# Patient Record
Sex: Female | Born: 1995 | Race: Black or African American | Hispanic: No | Marital: Single | Smoking: Never smoker
Health system: Southern US, Community
[De-identification: ages and names within clinical notes are randomized; demographics above are authoritative.]

## PROBLEM LIST (undated history)

## (undated) DIAGNOSIS — J45909 Unspecified asthma, uncomplicated: Secondary | ICD-10-CM

## (undated) DIAGNOSIS — D649 Anemia, unspecified: Secondary | ICD-10-CM

---
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
## 2016-01-01 ENCOUNTER — Emergency Department (HOSPITAL_COMMUNITY)
Admission: EM | Admit: 2016-01-01 | Discharge: 2016-01-01 | Disposition: A | Discharge status: Home / Self Care | Payer: Managed Care, Other (non HMO) | Attending: Emergency Medicine | Admitting: Emergency Medicine

## 2016-01-01 ENCOUNTER — Emergency Department (HOSPITAL_COMMUNITY): Payer: Managed Care, Other (non HMO)

## 2016-01-01 DIAGNOSIS — J029 Acute pharyngitis, unspecified: Secondary | ICD-10-CM

## 2016-01-01 HISTORY — DX: Unspecified asthma, uncomplicated: J45.909

## 2016-01-01 LAB — URINALYSIS, ROUTINE W REFLEX MICROSCOPIC
Glucose, UA: NEGATIVE mg/dL
Hgb urine dipstick: NEGATIVE
Ketones, ur: 40 mg/dL — AB
Leukocytes, UA: NEGATIVE
Nitrite: NEGATIVE
Protein, ur: 30 mg/dL — AB
SPECIFIC GRAVITY, URINE: 1.026 (ref 1.005–1.030)
pH: 6 (ref 5.0–8.0)

## 2016-01-01 LAB — POC URINE PREG, ED: PREG TEST UR: NEGATIVE

## 2016-01-01 LAB — URINE MICROSCOPIC-ADD ON

## 2016-01-01 LAB — RAPID STREP SCREEN (MED CTR MEBANE ONLY): Streptococcus, Group A Screen (Direct): NEGATIVE

## 2016-01-01 MED ORDER — PENICILLIN V POTASSIUM 250 MG PO TABS
500.0000 mg | ORAL_TABLET | Freq: Once | ORAL | Status: AC
  Administered 2016-01-01: 500 mg via ORAL
  Filled 2016-01-01: qty 2

## 2016-01-01 MED ORDER — ALBUTEROL SULFATE (2.5 MG/3ML) 0.083% IN NEBU
5.0000 mg | INHALATION_SOLUTION | Freq: Once | RESPIRATORY_TRACT | Status: AC
  Administered 2016-01-01: 5 mg via RESPIRATORY_TRACT
  Filled 2016-01-01: qty 6

## 2016-01-01 MED ORDER — PENICILLIN V POTASSIUM 500 MG PO TABS: 500.0000 mg | ORAL_TABLET | Freq: Three times a day (TID) | ORAL | 0 refills | Status: DC

## 2016-01-01 MED ORDER — IPRATROPIUM BROMIDE 0.02 % IN SOLN
0.5000 mg | Freq: Once | RESPIRATORY_TRACT | Status: AC
  Administered 2016-01-01: 0.5 mg via RESPIRATORY_TRACT
  Filled 2016-01-01: qty 2.5

## 2016-01-01 NOTE — ED Notes (Signed)
EDP at bedside  

## 2016-01-01 NOTE — ED Provider Notes (Signed)
[facility] Note   CSN: [id number] Arrival date & time: 12/31/15  2254     History   Chief Complaint Chief Complaint  Patient presents with  . Generalized Body Aches    HPI Jamie Owen is a 20 y.o. female.  HPI   Patient to the ER with complaints of generalized body aches, cold like symptoms and congestion in her chest as well as sore throat. She had her last menstrual period of August 29.  She says that all of her symptoms started a few days ago, her chest feels tight with wheezing and cough. She has been coughing up clear sputum. She denies CP, le swelling, nausea, vomiting. Her throat pain is bilateral. The patient is febrile in triage.  Past Medical History:  Diagnosis Date  . Asthma     There are no active problems to display for this patient.   History reviewed. No pertinent surgical history.  OB History    No data available       Home Medications    Prior to Admission medications   Medication Sig Start Date End Date Taking? Authorizing Provider  penicillin v potassium (VEETID) 500 MG tablet Take 1 tablet (500 mg total) by mouth 3 (three) times daily. 01/01/16   Marlon Pel, PA-C    Family History No family history on file.  Social History Social History  Substance Use Topics  . Smoking status: Never Smoker  . Smokeless tobacco: Never Used  . Alcohol use No     Allergies   Review of patient's allergies indicates no known allergies.   Review of Systems Review of Systems  Review of Systems All other systems negative except as documented in the HPI. All pertinent positives and negatives as reviewed in the HPI.  Physical Exam Updated Vital Signs BP 120/66   Pulse 99   Temp 99 F (37.2 C) (Oral)   Resp 17   Ht 5\' 1"  (1.549 m)   Wt 56.7 kg   LMP 12/21/2015   SpO2 100%   BMI 23.62 kg/m   Physical Exam  Constitutional: She is oriented to person, place, and time. She appears well-developed and well-nourished. No  distress.  HENT:  Head: Normocephalic and atraumatic.  Right Ear: Tympanic membrane, external ear and ear canal normal.  Left Ear: Tympanic membrane, external ear and ear canal normal.  Nose: Nose normal. No rhinorrhea. Right sinus exhibits no maxillary sinus tenderness and no frontal sinus tenderness. Left sinus exhibits no maxillary sinus tenderness and no frontal sinus tenderness.  Mouth/Throat: Uvula is midline and mucous membranes are normal. No trismus in the jaw. Normal dentition. No dental abscesses or uvula swelling. No oropharyngeal exudate, posterior oropharyngeal edema, posterior oropharyngeal erythema or tonsillar abscesses.  No submental edema, tongue not elevated, no trismus. No impending airway obstruction; Pt able to speak full sentences, swallow intact, no drooling, stridor, or tonsillar/uvula displacement. No palatal petechia  Eyes: Conjunctivae are normal. Pupils are equal, round, and reactive to light.  Neck: Trachea normal, normal range of motion and full passive range of motion without pain. Neck supple. No neck rigidity. Normal range of motion present. No Brudzinski's sign noted.  Flexion and extension of neck without pain or difficulty. Able to breath without difficulty in extension.  Cardiovascular: Normal rate and regular rhythm.   Pulmonary/Chest: Effort normal and breath sounds normal. No stridor. No respiratory distress. She has no wheezes.  Abdominal: Soft. There is no tenderness.  No obvious evidence of splenomegaly. Non ttp.  Musculoskeletal: Normal range of motion.  Lymphadenopathy:       Head (right side): No preauricular and no posterior auricular adenopathy present.       Head (left side): No preauricular and no posterior auricular adenopathy present.    She has cervical adenopathy.  Neurological: She is alert and oriented to person, place, and time.  Skin: Skin is warm and dry. No rash noted. She is not diaphoretic.  Psychiatric: She has a normal mood and  affect.  Nursing note and vitals reviewed.   ED Treatments / Results  Labs (all labs ordered are listed, but only abnormal results are displayed) Labs Reviewed  URINALYSIS, ROUTINE W REFLEX MICROSCOPIC (NOT AT [facility]) - Abnormal; Notable for the following:       Result Value   APPearance CLOUDY (*)    Bilirubin Urine SMALL (*)    Ketones, ur 40 (*)    Protein, ur 30 (*)    All other components within normal limits  URINE MICROSCOPIC-ADD ON - Abnormal; Notable for the following:    Squamous Epithelial / LPF 0-5 (*)    Bacteria, UA FEW (*)    All other components within normal limits  RAPID STREP SCREEN (NOT [facility])  CULTURE, GROUP A STREP ([facility])  POC URINE PREG, ED    EKG  EKG Interpretation None       Radiology Dg Chest 2 View  Result Date: 01/01/2016 CLINICAL DATA:  Acute onset of fever, cough, congestion and sore throat. Initial encounter. EXAM: CHEST  2 VIEW COMPARISON:  None. FINDINGS: The lungs are well-aerated and clear. There is no evidence of focal opacification, pleural effusion or pneumothorax. The heart is normal in size; the mediastinal contour is within normal limits. No acute osseous abnormalities are seen. IMPRESSION: No acute cardiopulmonary process seen. Electronically Signed   By: Roanna RaiderJeffery  Chang M.D.   On: 01/01/2016 03:07    Procedures Procedures (including critical care time)  Medications Ordered in ED Medications  acetaminophen (TYLENOL) tablet 650 mg (650 mg Oral Given 12/31/15 2318)  penicillin v potassium (VEETID) tablet 500 mg (500 mg Oral Given 01/01/16 0349)  albuterol (PROVENTIL) (2.5 MG/3ML) 0.083% nebulizer solution 5 mg (5 mg Nebulization Given 01/01/16 0349)  ipratropium (ATROVENT) nebulizer solution 0.5 mg (0.5 mg Nebulization Given 01/01/16 0349)     Initial Impression / Assessment and Plan / ED Course  I have reviewed the triage vital signs and the nursing notes.  Pertinent labs & imaging results that were available during my care of the  patient were reviewed by me and considered in my medical decision making (see chart for details).  Clinical Course    Patients lungs sounds were unremarkable, however, she feels like her chest is tight, this improved with nebulized Albuterol and Atrovent.  Pt symptoms consistent with URI.  CXR negative for acute infiltrate, strep screen is negative. Pt will be discharged with symptomatic treatment and Penicillin rx.  Discussed return precautions.  Pt is hemodynamically stable & in NAD prior to discharge.  Blood pressure 120/66, pulse 99, temperature 99 F (37.2 C), temperature source Oral, resp. rate 17, height 5\' 1"  (1.549 m), weight 56.7 kg, last menstrual period 12/21/2015, SpO2 100 %.    Final Clinical Impressions(s) / ED Diagnoses   Final diagnoses:  Sore throat    New Prescriptions New Prescriptions   PENICILLIN V POTASSIUM (VEETID) 500 MG TABLET    Take 1 tablet (500 mg total) by mouth 3 (three) times daily.  Marlon Pel, PA-C 01/01/16 0403    Melene Plan, DO 01/01/16 4098

## 2016-01-03 LAB — CULTURE, GROUP A STREP (THRC)

## 2017-03-23 ENCOUNTER — Other Ambulatory Visit: Payer: Self-pay

## 2017-03-23 ENCOUNTER — Encounter (HOSPITAL_COMMUNITY): Payer: Self-pay | Admitting: Emergency Medicine

## 2017-03-23 ENCOUNTER — Ambulatory Visit (HOSPITAL_COMMUNITY)
Admission: EM | Admit: 2017-03-23 | Discharge: 2017-03-23 | Disposition: A | Discharge status: Home / Self Care | Payer: Managed Care, Other (non HMO) | Attending: Family Medicine | Admitting: Family Medicine

## 2017-03-23 DIAGNOSIS — Z79899 Other long term (current) drug therapy: Secondary | ICD-10-CM | POA: Insufficient documentation

## 2017-03-23 DIAGNOSIS — Z91018 Allergy to other foods: Secondary | ICD-10-CM | POA: Diagnosis not present

## 2017-03-23 DIAGNOSIS — M791 Myalgia, unspecified site: Secondary | ICD-10-CM | POA: Diagnosis not present

## 2017-03-23 DIAGNOSIS — J029 Acute pharyngitis, unspecified: Secondary | ICD-10-CM | POA: Diagnosis not present

## 2017-03-23 DIAGNOSIS — R51 Headache: Secondary | ICD-10-CM

## 2017-03-23 DIAGNOSIS — J45909 Unspecified asthma, uncomplicated: Secondary | ICD-10-CM | POA: Insufficient documentation

## 2017-03-23 HISTORY — DX: Anemia, unspecified: D64.9

## 2017-03-23 LAB — POCT RAPID STREP A: STREPTOCOCCUS, GROUP A SCREEN (DIRECT): NEGATIVE

## 2017-03-23 MED ORDER — FLUCONAZOLE 150 MG PO TABS: ORAL_TABLET | ORAL | 0 refills | Status: AC

## 2017-03-23 MED ORDER — AMOXICILLIN 500 MG PO CAPS: 1000.0000 mg | ORAL_CAPSULE | Freq: Two times a day (BID) | ORAL | 0 refills | Status: AC

## 2017-03-23 NOTE — Discharge Instructions (Signed)
Take the antibiotic as directed. Be sure to drink plenty of water and other fluids. Take ibuprofen 600 mg every 6 hours as needed for sore throat pain, body aches and fever. Do not go anywhere, especially school or class rooms or other populated areas until you have been on antibiotics for 24 hours.

## 2017-03-23 NOTE — ED Triage Notes (Signed)
Pt c/o generalized body aches since Wednesday and sore throat.

## 2017-03-23 NOTE — ED Provider Notes (Signed)
[facility]    CSN: [id number] Arrival date & time: 03/23/17  1248     History   Chief Complaint Chief Complaint  Patient presents with  . Sore Throat  . Generalized Body Aches    HPI Jamie Owen is a 21 y.o. female.   21 year old female complaining of body aches for 3 days. She also has a sore throat, headache, I select a burning and questionable fever. Denies cough although she has a history of asthma. Denies GI symptoms or UTI symptoms.      Past Medical History:  Diagnosis Date  . Anemia   . Asthma     There are no active problems to display for this patient.   History reviewed. No pertinent surgical history.  OB History    No data available       Home Medications    Prior to Admission medications   Medication Sig Start Date End Date Taking? Authorizing Provider  IRON PO Take by mouth.   Yes [provider]  amoxicillin (AMOXIL) 500 MG capsule Take 2 capsules (1,000 mg total) by mouth 2 (two) times daily. 03/23/17   Hayden RasmussenMabe, Myquan [place], NP    Family History No family history on file.  Social History Social History   Tobacco Use  . Smoking status: Never Smoker  . Smokeless tobacco: Never Used  Substance Use Topics  . Alcohol use: No  . Drug use: Not on file     Allergies   Almond (diagnostic); Corn-containing products; and Watermelon [citrullus vulgaris]   Review of Systems Review of Systems  Constitutional: Positive for fatigue and fever.  HENT: Negative.   Gastrointestinal: Negative.   Genitourinary: Negative for dysuria, frequency, hematuria, urgency and vaginal bleeding.  Musculoskeletal: Positive for myalgias.  Neurological: Positive for headaches.  All other systems reviewed and are negative.    Physical Exam Triage Vital Signs ED Triage Vitals  Enc Vitals Group     BP 03/23/17 1310 111/72     Pulse Rate 03/23/17 1310 (!) 104     Resp 03/23/17 1310 18     Temp 03/23/17 1310 99.2 F (37.3 C)     Temp src  --      SpO2 03/23/17 1310 100 %     Weight --      Height --      Head Circumference --      Peak Flow --      Pain Score 03/23/17 1311 10     Pain Loc --      Pain Edu? --      Excl. in GC? --    No data found.  Updated Vital Signs BP 111/72   Pulse (!) 104   Temp 99.2 F (37.3 C)   Resp 18   SpO2 100%   Visual Acuity Right Eye Distance:   Left Eye Distance:   Bilateral Distance:    Right Eye Near:   Left Eye Near:    Bilateral Near:     Physical Exam  Constitutional: She is oriented to person, place, and time. She appears well-developed and well-nourished.  Non-toxic appearance. She does not appear ill.  HENT:  Head: Normocephalic and atraumatic.  Mouth/Throat: Oropharyngeal exudate, posterior oropharyngeal edema and posterior oropharyngeal erythema present. Tonsils are 3+ on the right. Tonsils are 3+ on the left. Tonsillar exudate.  Neck: Normal range of motion. Neck supple.  Cardiovascular: Normal rate.  Pulmonary/Chest: Effort normal and breath sounds normal.  Abdominal: Soft. There is no  tenderness.  Lymphadenopathy:    She has cervical adenopathy.  Neurological: She is alert and oriented to person, place, and time.  Skin: Skin is warm and dry.  Nursing note and vitals reviewed.    UC Treatments / Results  Labs (all labs ordered are listed, but only abnormal results are displayed) Labs Reviewed  CULTURE, GROUP A STREP [facility])  POCT RAPID STREP A    EKG  EKG Interpretation None       Radiology No results found.  Procedures Procedures (including critical care time)  Medications Ordered in UC Medications - No data to display   Initial Impression / Assessment and Plan / UC Course  I have reviewed the triage vital signs and the nursing notes.  Pertinent labs & imaging results that were available during my care of the patient were reviewed by me and considered in my medical decision making (see chart for details).    Take the antibiotic as  directed. Be sure to drink plenty of water and other fluids. Take ibuprofen 600 mg every 6 hours as needed for sore throat pain, body aches and fever. Do not go anywhere, especially school or class rooms or other populated areas until you have been on antibiotics for 24 hours.     Final Clinical Impressions(s) / UC Diagnoses   Final diagnoses:  Exudative pharyngitis    ED Discharge Orders        Ordered    amoxicillin (AMOXIL) 500 MG capsule  2 times daily     03/23/17 1405    Diflucan 150 mg 2 tablets were prescribed as the patient was leaving at her request.   Controlled Substance Prescriptions [place] Controlled Substance Registry consulted? Not Applicable   Hayden RasmussenMabe, Vivianna Piccini, NP 03/23/17 1418

## 2017-03-25 LAB — CULTURE, GROUP A STREP (THRC)

## 2017-07-31 ENCOUNTER — Encounter (HOSPITAL_COMMUNITY): Payer: Self-pay | Admitting: Emergency Medicine

## 2017-07-31 ENCOUNTER — Ambulatory Visit (HOSPITAL_COMMUNITY)
Admission: EM | Admit: 2017-07-31 | Discharge: 2017-07-31 | Disposition: A | Discharge status: Home / Self Care | Payer: Managed Care, Other (non HMO) | Attending: Family Medicine | Admitting: Family Medicine

## 2017-07-31 DIAGNOSIS — N939 Abnormal uterine and vaginal bleeding, unspecified: Secondary | ICD-10-CM | POA: Diagnosis not present

## 2017-07-31 DIAGNOSIS — J45909 Unspecified asthma, uncomplicated: Secondary | ICD-10-CM | POA: Diagnosis not present

## 2017-07-31 DIAGNOSIS — Z79899 Other long term (current) drug therapy: Secondary | ICD-10-CM | POA: Insufficient documentation

## 2017-07-31 DIAGNOSIS — Z975 Presence of (intrauterine) contraceptive device: Secondary | ICD-10-CM | POA: Insufficient documentation

## 2017-07-31 DIAGNOSIS — Z113 Encounter for screening for infections with a predominantly sexual mode of transmission: Secondary | ICD-10-CM | POA: Diagnosis present

## 2017-07-31 LAB — POCT URINALYSIS DIP (DEVICE)
Glucose, UA: NEGATIVE mg/dL
HGB URINE DIPSTICK: NEGATIVE
Leukocytes, UA: NEGATIVE
Nitrite: POSITIVE — AB
PROTEIN: 30 mg/dL — AB
UROBILINOGEN UA: 0.2 mg/dL (ref 0.0–1.0)
pH: 5.5 (ref 5.0–8.0)

## 2017-07-31 LAB — POCT PREGNANCY, URINE: Preg Test, Ur: NEGATIVE

## 2017-07-31 NOTE — ED Triage Notes (Signed)
Pt wants to be checked for STDs. Denies symptoms.  

## 2017-07-31 NOTE — ED Triage Notes (Signed)
Pt also c/o vaginal bleeding, worried about IUD placement.

## 2017-07-31 NOTE — Discharge Instructions (Signed)
We are checking you for gonorrhea, chlamydia, trichomonas, BV and yeast.  We will call

## 2017-07-31 NOTE — ED Provider Notes (Signed)
[facility]    CSN: [id number] Arrival date & time: 07/31/17  1406     History   Chief Complaint Chief Complaint  Patient presents with  . SEXUALLY TRANSMITTED DISEASE    HPI Claudia Pollockierra Skog is a 22 y.o. female presenting today with concern for screening for STDs as well as vaginal bleeding.  States that after she had sexual intercourse approximately 3 days ago she had a moderate amount of vaginal bleeding, this resolved but then returned again yesterday.  It is mainly just been spotting since yesterday.  Patient has IUD in place.  Last time she had a checkup with her OB/GYN, strings were not visualized but they had an ultrasound that showed proper IUD placement.  She denies any fevers, nausea, vomiting, abdominal pain, pelvic pain.  Denies any abnormal vaginal discharge.  Does endorse some dysuria, but this is not been persistent.  HPI  Past Medical History:  Diagnosis Date  . Anemia   . Asthma     There are no active problems to display for this patient.   History reviewed. No pertinent surgical history.  OB History   None      Home Medications    Prior to Admission medications   Medication Sig Start Date End Date Taking? Authorizing Provider  albuterol (PROVENTIL HFA;VENTOLIN HFA) 108 (90 Base) MCG/ACT inhaler Inhale into the lungs every 6 (six) hours as needed for wheezing or shortness of breath.   Yes [provider]  amoxicillin (AMOXIL) 500 MG capsule Take 2 capsules (1,000 mg total) by mouth 2 (two) times daily. Patient not taking: Reported on 07/31/2017 03/23/17   Hayden RasmussenMabe, David, NP  fluconazole (DIFLUCAN) 150 MG tablet 1 tab po x 1. May repeat in 72 hours if no improvement Patient not taking: Reported on 07/31/2017 03/23/17   Hayden RasmussenMabe, David, NP  fluconazole (DIFLUCAN) 150 MG tablet 1 tab po x 1. May repeat in 72 hours if no improvement Patient not taking: Reported on 07/31/2017 03/23/17   Hayden RasmussenMabe, David, NP  IRON PO Take by mouth.    [provider]    Family History No family history on file.  Social History Social History   Tobacco Use  . Smoking status: Never Smoker  . Smokeless tobacco: Never Used  Substance Use Topics  . Alcohol use: No  . Drug use: Not on file     Allergies   Almond (diagnostic); Corn-containing products; and Watermelon [citrullus vulgaris]   Review of Systems Review of Systems  Constitutional: Negative for fever.  Respiratory: Negative for shortness of breath.   Cardiovascular: Negative for chest pain.  Gastrointestinal: Negative for abdominal pain, diarrhea, nausea and vomiting.  Genitourinary: Positive for dysuria and vaginal bleeding. Negative for flank pain, genital sores, hematuria, menstrual problem, vaginal discharge and vaginal pain.  Musculoskeletal: Negative for back pain.  Skin: Negative for rash.  Neurological: Negative for dizziness, light-headedness and headaches.     Physical Exam Triage Vital Signs ED Triage Vitals  Enc Vitals Group     BP 07/31/17 1433 113/75     Pulse Rate 07/31/17 1433 77     Resp 07/31/17 1433 18     Temp 07/31/17 1433 98.3 F (36.8 C)     Temp src --      SpO2 07/31/17 1433 100 %     Weight --      Height --      Head Circumference --      Peak Flow --  Pain Score 07/31/17 1434 0     Pain Loc --      Pain Edu? --      Excl. in GC? --    No data found.  Updated Vital Signs BP 113/75   Pulse 77   Temp 98.3 F (36.8 C)   Resp 18   SpO2 100%   Visual Acuity Right Eye Distance:   Left Eye Distance:   Bilateral Distance:    Right Eye Near:   Left Eye Near:    Bilateral Near:     Physical Exam  Constitutional: She appears well-developed and well-nourished. No distress.  HENT:  Head: Normocephalic and atraumatic.  Eyes: Conjunctivae are normal.  Neck: Neck supple.  Cardiovascular: Normal rate and regular rhythm.  No murmur heard. Pulmonary/Chest: Effort normal and breath sounds normal. No respiratory distress.    Abdominal: Soft. There is no tenderness.  Genitourinary:  Genitourinary Comments: Normal female external genitalia, no rashes or lesions visualized, small minor bumps that appeared to be more related to shaving rather than herpes.  On speculum, no bleeding or blood visualized in vaginal vault, thick yellow discharge at cervical office, strings not visualized with removal of discharge, cervix nonerythematous, no CMT or adnexal tenderness on bimanual exam.  Musculoskeletal: She exhibits no edema.  Neurological: She is alert.  Skin: Skin is warm and dry.  Psychiatric: She has a normal mood and affect.  Nursing note and vitals reviewed.    UC Treatments / Results  Labs (all labs ordered are listed, but only abnormal results are displayed) Labs Reviewed  CERVICOVAGINAL ANCILLARY ONLY    EKG None Radiology No results found.  Procedures Procedures (including critical care time)  Medications Ordered in UC Medications - No data to display   Initial Impression / Assessment and Plan / UC Course  I have reviewed the triage vital signs and the nursing notes.  Pertinent labs & imaging results that were available during my care of the patient were reviewed by me and considered in my medical decision making (see chart for details).     Patient likely with vaginal spotting related to sexual intercourse versus IUD dislodgment.  Patient without any abdominal pain or pelvic pain.  Strings not visualized, but patient states that they were previously not visualized before.  Will send vaginal swab for STDs.  Will wait for results for treatment. Discussed strict return precautions. Patient verbalized understanding and is agreeable with plan.   Final Clinical Impressions(s) / UC Diagnoses   Final diagnoses:  Vaginal bleeding  Screening for STD (sexually transmitted disease)    ED Discharge Orders    None       Controlled Substance Prescriptions [place] Controlled Substance Registry  consulted? Not Applicable   Lew Dawes, [place] 07/31/17 1529

## 2017-08-01 ENCOUNTER — Telehealth (HOSPITAL_COMMUNITY): Payer: Self-pay

## 2017-08-01 LAB — CERVICOVAGINAL ANCILLARY ONLY
BACTERIAL VAGINITIS: NEGATIVE
CANDIDA VAGINITIS: NEGATIVE
Chlamydia: NEGATIVE
NEISSERIA GONORRHEA: NEGATIVE
TRICH (WINDOWPATH): NEGATIVE

## 2017-08-01 NOTE — Telephone Encounter (Signed)
Pt contacted regarding results from recent visit being within normal range. Answered all questions. 

## 2018-01-18 IMAGING — DX DG CHEST 2V
2 series · 2 of 2 positions shown · non-contrast
Comparison: None.

CLINICAL DATA: Acute onset of fever, cough, congestion and sore
throat. Initial encounter.

EXAM:
CHEST  2 VIEW

[chest pa]
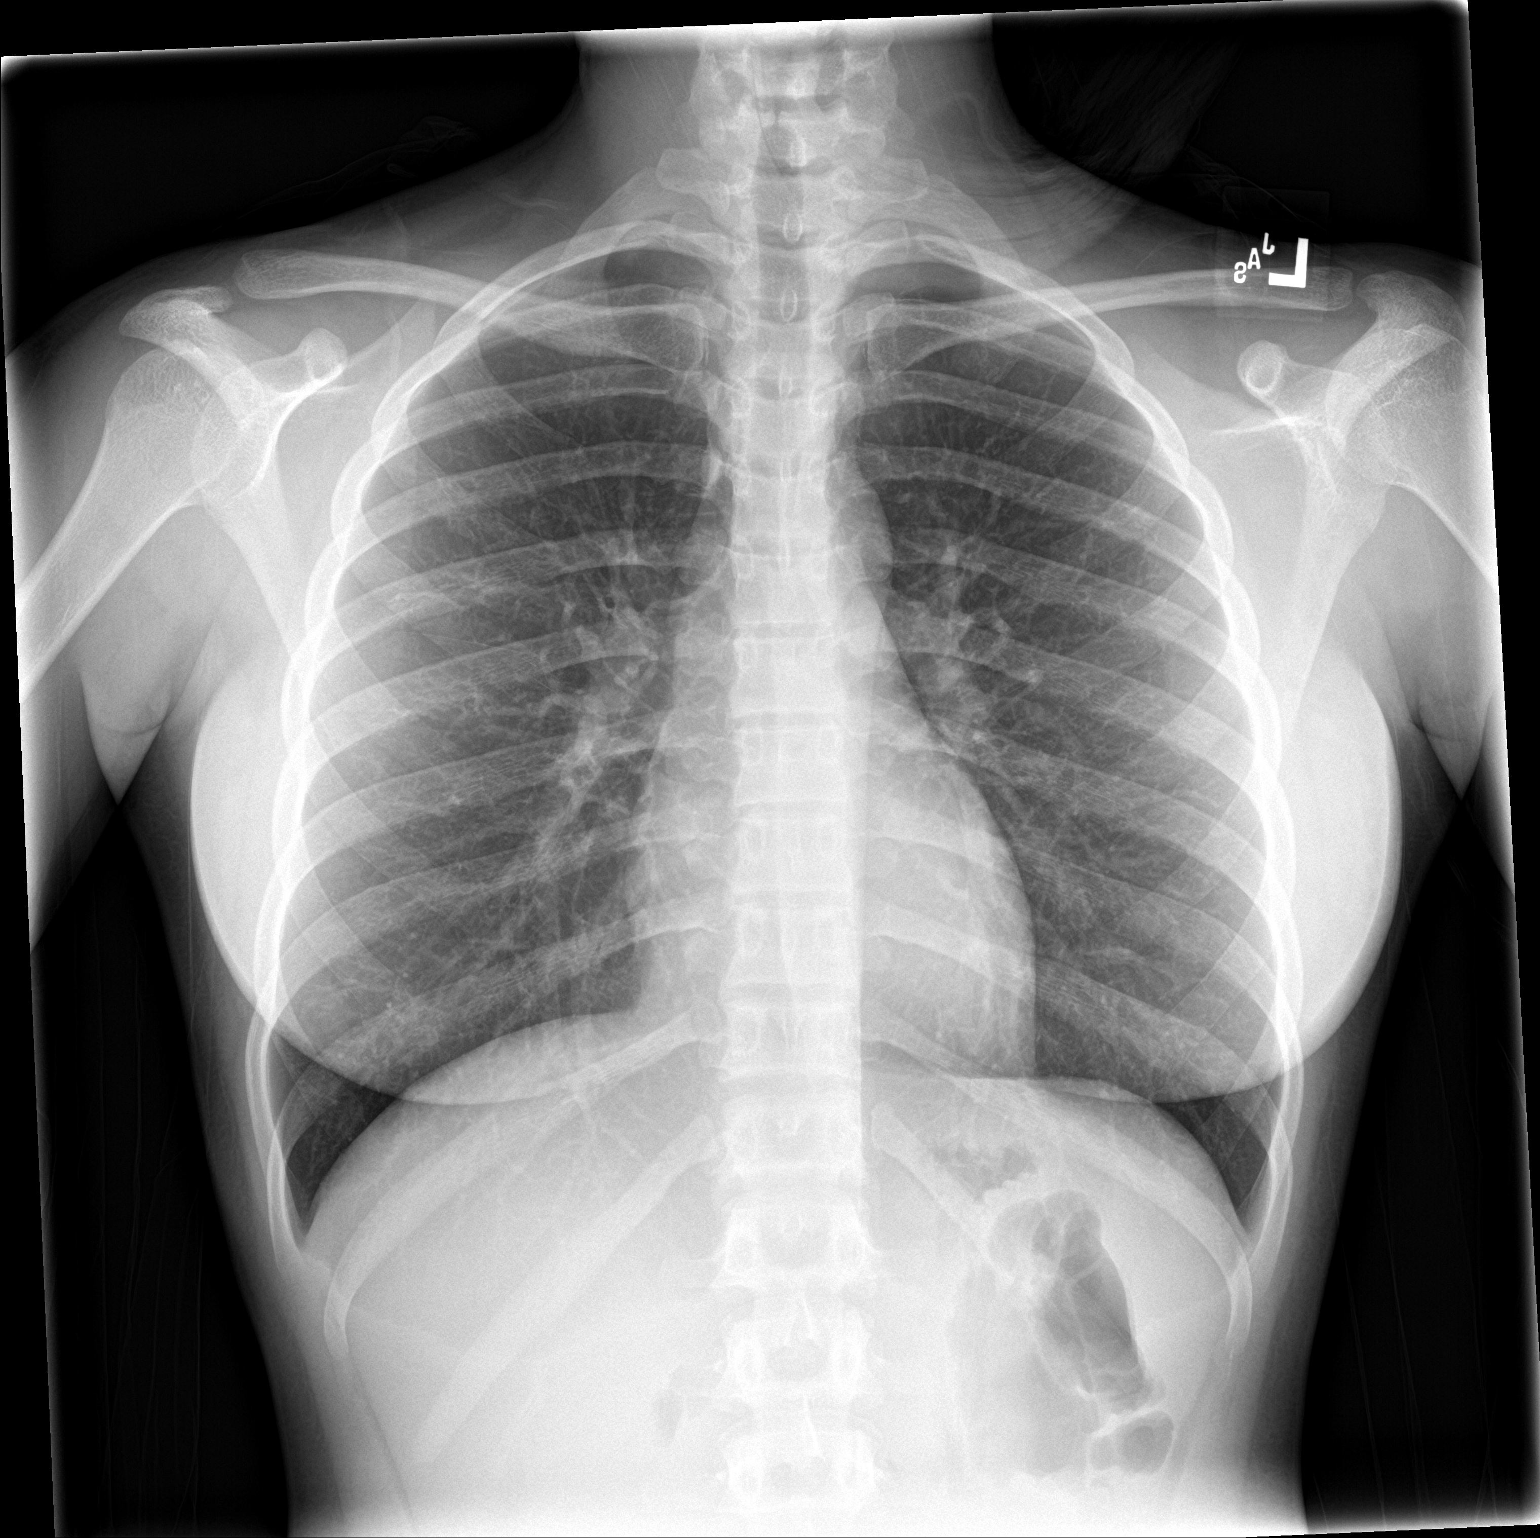

[chest lat]
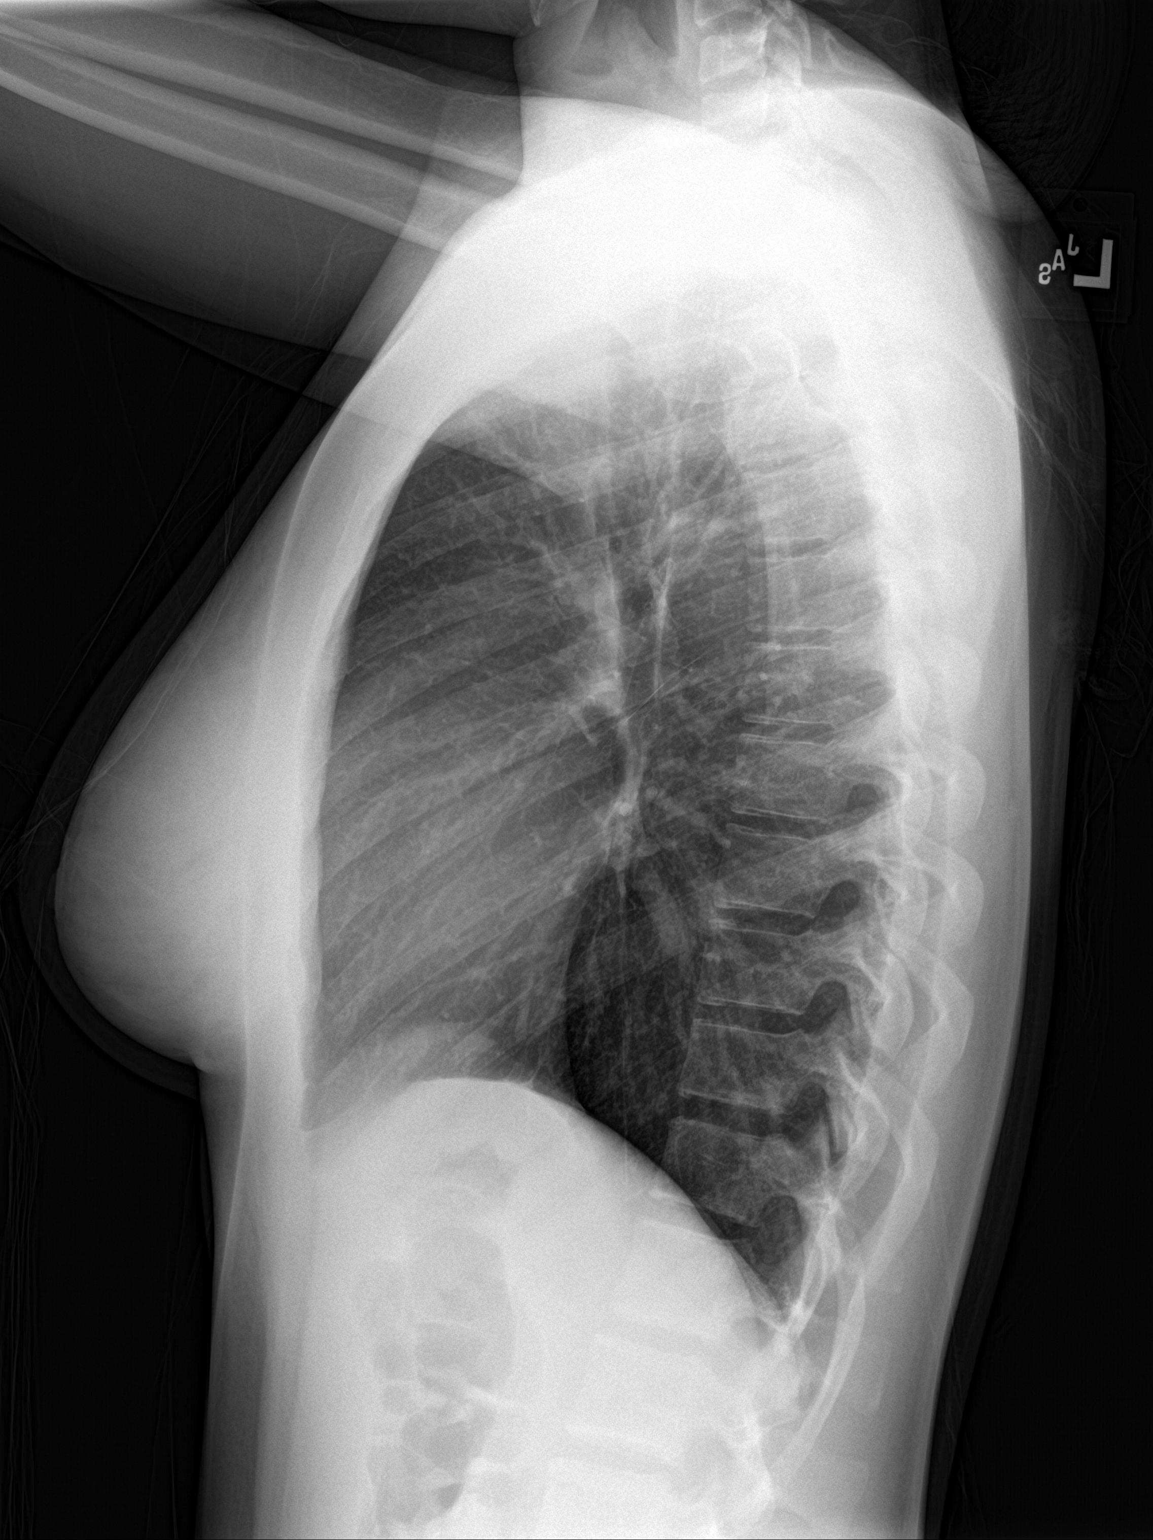

[2 of 2 positions shown; findings below may reference images not displayed]

FINDINGS: The lungs are well-aerated and clear. There is no evidence of focal
opacification, pleural effusion or pneumothorax.

The heart is normal in size; the mediastinal contour is within
normal limits. No acute osseous abnormalities are seen.
IMPRESSION: No acute cardiopulmonary process seen.
